# Patient Record
Sex: Male | Born: 1995 | Race: White | Hispanic: No | Marital: Single | Smoking: Never smoker
Health system: Southern US, Community
[De-identification: ages and names within clinical notes are randomized; demographics above are authoritative.]

## PROBLEM LIST (undated history)

## (undated) DIAGNOSIS — L709 Acne, unspecified: Secondary | ICD-10-CM

## (undated) DIAGNOSIS — R51 Headache: Secondary | ICD-10-CM

## (undated) DIAGNOSIS — F909 Attention-deficit hyperactivity disorder, unspecified type: Secondary | ICD-10-CM

## (undated) HISTORY — DX: Headache: R51

## (undated) HISTORY — PX: TONSILLECTOMY: SUR1361

## (undated) HISTORY — PX: ADENOIDECTOMY: SUR15

## (undated) HISTORY — DX: Acne, unspecified: L70.9

## (undated) HISTORY — DX: Attention-deficit hyperactivity disorder, unspecified type: F90.9

---
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
## 2016-12-16 ENCOUNTER — Emergency Department (HOSPITAL_COMMUNITY)
Admission: EM | Admit: 2016-12-16 | Discharge: 2016-12-16 | Disposition: A | Discharge status: Home / Self Care | Payer: Medicaid Other | Attending: Emergency Medicine | Admitting: Emergency Medicine

## 2016-12-16 ENCOUNTER — Encounter (HOSPITAL_COMMUNITY): Payer: Self-pay

## 2016-12-16 DIAGNOSIS — J45909 Unspecified asthma, uncomplicated: Secondary | ICD-10-CM | POA: Insufficient documentation

## 2016-12-16 DIAGNOSIS — R69 Illness, unspecified: Secondary | ICD-10-CM

## 2016-12-16 DIAGNOSIS — F909 Attention-deficit hyperactivity disorder, unspecified type: Secondary | ICD-10-CM | POA: Insufficient documentation

## 2016-12-16 DIAGNOSIS — J111 Influenza due to unidentified influenza virus with other respiratory manifestations: Secondary | ICD-10-CM | POA: Insufficient documentation

## 2016-12-16 DIAGNOSIS — Z79899 Other long term (current) drug therapy: Secondary | ICD-10-CM | POA: Insufficient documentation

## 2016-12-16 DIAGNOSIS — H6123 Impacted cerumen, bilateral: Secondary | ICD-10-CM | POA: Insufficient documentation

## 2016-12-16 NOTE — ED Notes (Signed)
Felt bad since Sunday, on Monday saw [facility] and dx with ear infection, only used the drops x 1, (  Left  Ear), now he has had body aches and chills, hurts to cough,

## 2016-12-16 NOTE — ED Triage Notes (Signed)
Pt reports generalized body aches and weakness that started on Sunday. [facility] with [facility] infection and a stomach bug on Monday. Then on Tuesday he reports he got worse and his entire body started aching. Denies n/v/d in past 24 hours.

## 2016-12-16 NOTE — ED Provider Notes (Signed)
[facility] Provider Note   CSN: [id number] Arrival date & time: 12/16/16  1120   By signing my name below, I, Soijett Blue, attest that this documentation has been prepared under the direction and in the presence of Rolan Bucco, MD. Electronically Signed: Soijett Blue, ED Scribe. 12/16/16. 12:21 PM.  History   Chief Complaint Chief Complaint  Patient presents with  . Generalized Body Aches    HPI Andrew Barton is a 21 y.o. male who presents to the Emergency Department complaining of generalized body aches onset 4 days ago. Pt reports gradually worsening associated resolved vomiting x 2 days ago, left ear pain, sore throat, nasal congestion, rhinorrhea, subjective fever, cough, chest wall tenderness due to cough, and abdominal pain worsened with movement/cough. Pt has tried ibuprofen with no relief of his symptoms. Pt notes that he was evaluated by [facility] 3 days ago and dx with a stomach bug and ear infection and placed on abx ear drops that he stopped using. Denies diarrhea, difficulty urinating, dysuria, rash, and any other symptoms.    The history is provided by the patient. No language interpreter was used.    Past Medical History:  Diagnosis Date  . Acne   . ADHD (attention deficit hyperactivity disorder)   . Asthma   . WUJWJXBJ(478.2)     Patient Active Problem List   Diagnosis Date Noted  . Low back pain 11/20/2012  . Migraine 01/28/2011  . Seasonal allergies 01/28/2011  . Contact dermatitis 01/12/2011  . ADHD 09/02/2009  . ASTHMA, MILD 09/02/2009  . 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 ADENOIDECTOMY    . TONSILLECTOMY         Home Medications    Prior to Admission medications   Medication Sig Start Date End Date Taking? Authorizing Provider  meloxicam (MOBIC) 15 MG tablet Take 1 tablet (15 mg total) by mouth daily. 11/20/12   Shelly Rubenstein, MD    Family History Family History    Problem Relation Age of Onset  . COPD Mother   . Hypertension Mother   . Depression Mother   . Migraines Mother   . Depression Father   . Migraines Sister     Social History Social History  Substance Use Topics  . Smoking status: Never Smoker  . Smokeless tobacco: Never Used  . Alcohol use No     Allergies   Patient has no known allergies.   Review of Systems Review of Systems  Constitutional: Positive for fever (subjective).  HENT: Positive for congestion, ear pain (left), rhinorrhea and sore throat.   Respiratory: Positive for cough.        +Chest wall tenderness due to cough  Gastrointestinal: Positive for abdominal pain (due to cough and movement) and vomiting (resolved). Negative for diarrhea.  Genitourinary: Negative for difficulty urinating and dysuria.  Musculoskeletal: Positive for myalgias.  Skin: Negative for rash.     Physical Exam Updated Vital Signs BP (!) 128/57 (BP Location: Right Arm)   Pulse 62   Temp 98.2 F (36.8 C) (Oral)   Resp 16   SpO2 100%   Physical Exam  Constitutional: He is oriented to person, place, and time. He appears well-developed and well-nourished.  HENT:  Head: Normocephalic and atraumatic.  Bilateral cerumen impaction.  Eyes: Pupils are equal, round, and reactive to light.  Neck: Normal range of motion. Neck supple.  Cardiovascular: Normal rate, regular rhythm and normal heart sounds.  Pulmonary/Chest: Effort normal and breath sounds normal. No respiratory distress. He has no wheezes. He has no rales. He exhibits no tenderness.  Abdominal: Soft. Bowel sounds are normal. There is no tenderness. There is no rebound and no guarding.  Musculoskeletal: Normal range of motion. He exhibits no edema.  Lymphadenopathy:    He has no cervical adenopathy.  Neurological: He is alert and oriented to person, place, and time.  Skin: Skin is warm and dry. No rash noted.  Psychiatric: He has a normal mood and affect.     ED  Treatments / Results  DIAGNOSTIC STUDIES: Oxygen Saturation is 100% on RA, nl by my interpretation.    COORDINATION OF CARE: 12:20 PM Discussed treatment plan with pt at bedside and pt agreed to plan.   Procedures Procedures (including critical care time)  Medications Ordered in ED Medications - No data to display   Initial Impression / Assessment and Plan / ED Course  I have reviewed the triage vital signs and the nursing notes.  PT presents with a flu like illness.  He is well appearing, His symptoms seem to be improving.  No suggestions of pneumonia.  No SOB.  No ongoing vomiting.  Patient given symptomatic care instructions. Return precautions were given.  Final Clinical Impressions(s) / ED Diagnoses   Final diagnoses:  Influenza-like illness    New Prescriptions Discharge Medication List as of 12/16/2016 12:22 PM     I personally performed the services described in this documentation, which was scribed in my presence.  The recorded information has been reviewed and considered.     Rolan BuccoMelanie Dustin Burrill, MD 12/16/16 1248

## 2017-03-25 ENCOUNTER — Encounter: Payer: Self-pay | Admitting: Podiatry

## 2017-03-25 ENCOUNTER — Ambulatory Visit (INDEPENDENT_AMBULATORY_CARE_PROVIDER_SITE_OTHER): Payer: Managed Care, Other (non HMO) | Admitting: Podiatry

## 2017-03-25 DIAGNOSIS — L03012 Cellulitis of left finger: Secondary | ICD-10-CM

## 2017-03-25 DIAGNOSIS — L600 Ingrowing nail: Secondary | ICD-10-CM | POA: Diagnosis not present

## 2017-03-25 MED ORDER — HYDROCODONE-ACETAMINOPHEN 10-325 MG PO TABS: 1.0000 | ORAL_TABLET | Freq: Three times a day (TID) | ORAL | 0 refills | Status: DC | PRN

## 2017-03-25 MED ORDER — CEPHALEXIN 500 MG PO CAPS: 500.0000 mg | ORAL_CAPSULE | Freq: Three times a day (TID) | ORAL | 0 refills | Status: AC

## 2017-03-25 NOTE — Progress Notes (Signed)
   Subjective:    Patient ID: Andrew Barton, male    DOB: 01-Sep-1996, 21 y.o.   MRN: [id number]  HPI Chief Complaint  Patient presents with  . Ingrown Toenail    B/L 1st, 2nd toenail have redness/swollen/dranage for about 3 years.      Review of Systems  Skin: Positive for color change.       Objective:   Physical Exam        Assessment & Plan:

## 2017-03-25 NOTE — Progress Notes (Signed)
Subjective:    Patient ID: Andrew Barton, male   DOB: 21 y.o.   MRN: [id number]   HPI patient presents with caregiver stating that he's had very painful hallux nails bilateral third nails of both feet with chronic ingrown toenails that have made it difficult for him to wear shoe gear comfortably. States she's tried to soak his have the nails removed in the past but never anything permanently    Review of Systems  All other systems reviewed and are negative.       Objective:  Physical Exam  Cardiovascular: Intact distal pulses.   Musculoskeletal: Normal range of motion.  Neurological: He is alert.  Skin: Skin is warm.  Nursing note and vitals reviewed.  neurovascular status intact muscle strength adequate range of motion was found to be within normal limits. Patient's found to have damaged thickened third nail right lateral border right hallux severely damaged hallux nail left and medial border of the third digit right. There chronically irritated and there is ingrown component with redness with no active drainage noted currently. Patient's found to have good digital perfusion and is well oriented 3     Assessment:    Severe nail damage to the hallux left third right with ingrown toenails of the lateral border right hallux medial border third digit left that are painful when palpated and consistent with chronic ingrown toenails     Plan:    H&P conditions reviewed and I've recommended removal of the hallux nail left permanently the third nail right the border of the right hallux and third digit left. I explained procedures and risk to patient and they want to have this done and at this point I infiltrated with 240 mg Xylocaine Marcaine mixture I then remove the hallux nail left and applied phenol 5 applications 30 seconds the nail border third left and applied phenol 3 applications 30 seconds followed by alcohol lavaged the right hallux lateral border 3 applications 30 seconds phenol  followed by alcohol lavaged and total removal of the third nail right with chemical application for treatments 30 seconds followed by alcohol lavage. I then gave instructions on soaks and bandage usage and placed on cephalexin 500 mg 3 times a day as precautionary measure and also went ahead and gave patient prescription for hydrocodone. Patient was instructed on soaks and reappoint

## 2017-03-25 NOTE — Patient Instructions (Signed)

## 2017-04-06 ENCOUNTER — Telehealth: Payer: Self-pay | Admitting: *Deleted

## 2017-04-06 NOTE — Telephone Encounter (Addendum)
Pt's mtr, Melissa states antibiotic pt is taking is not agreeing with him. Left message for Melissa to contact me.04/07/2017-I spoke with pt's mtr, Melissa she states the toes are draining thick pus, and so painful pt can hardly walk, and the antibiotic has his stomach tore up and he has stopped it. I offered to have Melissa schedule pt for an appt tomorrow due to possible infection. I told Melissa that I would also inform Dr. Charlsie Merlesegal to see if he wanted to change the antibiotic as well as see him.D. Miner - scheduler was contacted and she will contact Melissa to schedule for tomorrow.

## 2017-04-07 ENCOUNTER — Ambulatory Visit: Payer: Managed Care, Other (non HMO) | Admitting: Podiatry

## 2017-04-07 NOTE — Telephone Encounter (Signed)
Hi Andrew Barton, I'm returning your call from yesterday. My phone must have died. I don't think the cephalexin is helping any. If you could, please call me back at [phone].

## 2017-04-11 ENCOUNTER — Telehealth: Payer: Self-pay | Admitting: Podiatry

## 2017-04-11 NOTE — Telephone Encounter (Signed)
I told pt he would need to be seen if he was having the pus to the area, pt states he can come in tomorrow. I told him to continue the soaks and antibiotic ointment dressings as discussed before at procedure appt and with his mtr on the the phone. Pt states understanding. Pt give an appt 04/12/2017 at 10:45am with Dr. Al CorpusHyatt per D. Miner.

## 2017-04-11 NOTE — Telephone Encounter (Signed)
Toe/toenail is oozing pus and I am unable to tolerate the antibiotic prescribed to me.

## 2017-04-11 NOTE — Telephone Encounter (Signed)
Yes I have pus coming out of the toe that the procedure was done on.

## 2017-04-12 ENCOUNTER — Encounter: Payer: Self-pay | Admitting: Podiatry

## 2017-04-12 ENCOUNTER — Ambulatory Visit (INDEPENDENT_AMBULATORY_CARE_PROVIDER_SITE_OTHER): Payer: Managed Care, Other (non HMO) | Admitting: Podiatry

## 2017-04-12 DIAGNOSIS — L03039 Cellulitis of unspecified toe: Secondary | ICD-10-CM | POA: Diagnosis not present

## 2017-04-12 MED ORDER — HYDROCODONE-ACETAMINOPHEN 10-325 MG PO TABS: 1.0000 | ORAL_TABLET | Freq: Three times a day (TID) | ORAL | 0 refills | Status: AC | PRN

## 2017-04-12 MED ORDER — DOXYCYCLINE HYCLATE 100 MG PO TABS: 100.0000 mg | ORAL_TABLET | Freq: Two times a day (BID) | ORAL | 0 refills | Status: AC

## 2017-04-12 NOTE — Progress Notes (Signed)
He presents today with a chief complaint of pain to his toes there were surgically corrected. He states that there still is considerable drainage the antibiotic bothering her stomach. He states that he continues to soak his toes and Epsom salts twice daily and there is foul-smelling drainage from his left hallux.  Objective: Pulses are palpable. No calf pain left. Matrixectomy sites. Be healing of the hallux left is the slowest. There is still some serosanguineous drainage but currently I see no purulence even with pressure.  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  Plan: Start soaking Epsom salts and warm water increase to soft concentration to half a cup per 1 L. I also suggest that he discontinue every day twice a day soaking and just go to once every other day he saw also cover during the day and leave open at bedtime is not to use any other medications on the toenails at all. I wrote a prescription for doxycycline and pain medication.

## 2017-04-26 ENCOUNTER — Ambulatory Visit: Payer: 59 | Admitting: Podiatry

## 2017-05-17 ENCOUNTER — Ambulatory Visit: Payer: 59 | Admitting: Podiatry

## 2019-09-16 ENCOUNTER — Ambulatory Visit (INDEPENDENT_AMBULATORY_CARE_PROVIDER_SITE_OTHER): Payer: Self-pay

## 2019-09-16 ENCOUNTER — Encounter (HOSPITAL_COMMUNITY): Payer: Self-pay

## 2019-09-16 ENCOUNTER — Ambulatory Visit (HOSPITAL_COMMUNITY)
Admission: EM | Admit: 2019-09-16 | Discharge: 2019-09-16 | Disposition: A | Discharge status: Home / Self Care | Payer: Self-pay | Attending: Physician Assistant | Admitting: Physician Assistant

## 2019-09-16 ENCOUNTER — Other Ambulatory Visit: Payer: Self-pay

## 2019-09-16 DIAGNOSIS — W208XXA Other cause of strike by thrown, projected or falling object, initial encounter: Secondary | ICD-10-CM

## 2019-09-16 DIAGNOSIS — S92424A Nondisplaced fracture of distal phalanx of right great toe, initial encounter for closed fracture: Secondary | ICD-10-CM

## 2019-09-16 MED ORDER — IBUPROFEN 800 MG PO TABS: 800.0000 mg | ORAL_TABLET | Freq: Three times a day (TID) | ORAL | 0 refills | Status: AC

## 2019-09-16 NOTE — Discharge Instructions (Addendum)
Wear the walking boot given whenever walking for 3-4 weeks . You may transition to firm soled shoes/boots as tolerated.   Take ibuprofen 800mg  1 tablet 3 times a day(every 8 hours) for 3 days scheduled, and then as needed for pain every 8 hours. Do not exceed 3 doses in 24 hours  You may take tylenol/acetaminophen 325mg  2 tablets every 6 hours for pain as well. You may combine this with ibuprofen or stagger these medications. Do not exceed 4 doses of tylenol in 24 horus   Elevate your foot as much as possible for the next 48 hours. You may utilize Ice for the next 24 hours. After tomorrow, utilize heat pads for 20-30 minutes nightly.   If you have severe worsening of pain, loss of feeling, have a fever, notice increased redness spreading up your foot please come back to be re-evaluated.  If you are not having gradual improvement in the next 2-4 weeks please come back for re-evaluation.

## 2019-09-16 NOTE — ED Provider Notes (Signed)
[facility]    CSN: [id number] Arrival date & time: 09/16/19  1034      History   Chief Complaint Chief Complaint  Patient presents with  . Toe Injury    right foot     HPI Andrew Barton is a 24 y.o. male.   Patient reports to urgent care today for right big toe injury. He reports dropping a "200-300 pound coffee table on my foot yesterday morning". He reports the corner of the table fell directly onto his right foot. He was wearing boots but they were not steel-toed. He did not seek care until today because he did not feel the injury was that bad yesterday. This morning he woke up with discoloration, swelling and pain. He describes pain in the Right big toe and in area above his right big toe. He was able to ambulate after and able to ambulate today.   He has no other health concerns and takes no medications. He has never injured this foot.      Past Medical History:  Diagnosis Date  . Acne   . ADHD (attention deficit hyperactivity disorder)   . Asthma   . WUJWJXBJ(478.2)     Patient Active Problem List   Diagnosis Date Noted  . Low back pain 11/20/2012  . Migraine 01/28/2011  . Seasonal allergies 01/28/2011  . Contact dermatitis 01/12/2011  . ADHD 09/02/2009  . ASTHMA, MILD 09/02/2009  . 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 ADENOIDECTOMY    . TONSILLECTOMY         Home Medications    Prior to Admission medications   Medication Sig Start Date End Date Taking? Authorizing Provider  cephALEXin (KEFLEX) 500 MG capsule Take 1 capsule (500 mg total) by mouth 3 (three) times daily. Patient not taking: Reported on 04/12/2017 03/25/17   Lenn Sink, DPM  doxycycline (VIBRA-TABS) 100 MG tablet Take 1 tablet (100 mg total) by mouth 2 (two) times daily. 04/12/17   Hyatt, Max T, DPM  HYDROcodone-acetaminophen (NORCO) 10-325 MG tablet Take 1 tablet by mouth every 8 (eight) hours as needed. 04/12/17   Hyatt, Max  T, DPM  ibuprofen (ADVIL) 800 MG tablet Take 1 tablet (800 mg total) by mouth 3 (three) times daily. 09/16/19   Yaeko Fazekas, Veryl Speak, PA-C  meloxicam (MOBIC) 15 MG tablet Take 1 tablet (15 mg total) by mouth daily. 11/20/12   Shelly Rubenstein, MD    Family History Family History  Problem Relation Age of Onset  . COPD Mother   . Hypertension Mother   . Depression Mother   . Migraines Mother   . Depression Father   . Migraines Sister     Social History Social History   Tobacco Use  . Smoking status: Never Smoker  . Smokeless tobacco: Never Used  Substance Use Topics  . Alcohol use: No  . Drug use: No     Allergies   Patient has no known allergies.   Review of Systems Review of Systems  Constitutional: Negative for chills and fever.  Respiratory: Negative for cough and shortness of breath.   Cardiovascular: Negative for chest pain and palpitations.  Gastrointestinal: Negative for abdominal pain.  Musculoskeletal: Positive for arthralgias and myalgias. Negative for back pain.  Skin: Negative for color change and rash.  Hematological: Does not bruise/bleed easily.  All other systems reviewed and are negative.    Physical Exam Triage Vital Signs ED Triage  Vitals  Enc Vitals Group     BP 09/16/19 1052 (!) 151/73     Pulse Rate 09/16/19 1052 60     Resp 09/16/19 1052 16     Temp 09/16/19 1052 98 F (36.7 C)     Temp Source 09/16/19 1052 Oral     SpO2 09/16/19 1052 99 %     Weight --      Height --      Head Circumference --      Peak Flow --      Pain Score 09/16/19 1050 10     Pain Loc --      Pain Edu? --      Excl. in GC? --    No data found.  Updated Vital Signs BP (!) 151/73 (BP Location: Left Arm)   Pulse 60   Temp 98 F (36.7 C) (Oral)   Resp 16   SpO2 99%   Visual Acuity Right Eye Distance:   Left Eye Distance:   Bilateral Distance:    Right Eye Near:   Left Eye Near:    Bilateral Near:     Physical Exam Constitutional:      General: He  is not in acute distress.    Appearance: Normal appearance.  HENT:     Head: Normocephalic and atraumatic.  Eyes:     Pupils: Pupils are equal, round, and reactive to light.  Cardiovascular:     Rate and Rhythm: Normal rate and regular rhythm.     Pulses: Normal pulses.     Heart sounds: Normal heart sounds.  Pulmonary:     Effort: Pulmonary effort is normal.     Breath sounds: Normal breath sounds.  Musculoskeletal:     Comments: Right Foot- Edema and ecchymosis of the Right Great toe spreading proximally over the mid-foot. Edema is significant when compared to left. There is some tightness to the skin over the mid-foot.  Has focal TTP over the 1st MTP joint and along 1st metatarsal. Has good sensation in the distal great toe.   Skin:    General: Skin is warm and dry.  Neurological:     General: No focal deficit present.     Mental Status: He is alert and oriented to person, place, and time.  Psychiatric:        Mood and Affect: Mood normal.        Behavior: Behavior normal.        Thought Content: Thought content normal.        Judgment: Judgment normal.      UC Treatments / Results  Labs (all labs ordered are listed, but only abnormal results are displayed) Labs Reviewed - No data to display  EKG   Radiology DG Foot 2 Views Right  Result Date: 09/16/2019 CLINICAL DATA:  Posttraumatic right foot pain EXAM: RIGHT FOOT - 2 VIEW COMPARISON:  None available FINDINGS: Oblique, nondisplaced fracture through the shaft and lateral base of the great toe distal phalanx. No intra-articular joint extension. No opaque foreign body or dislocation IMPRESSION: Nondisplaced distal phalanx fracture of the great toe. Electronically Signed   By: Marnee SpringJonathon  Watts M.D.   On: 09/16/2019 11:29    Procedures Procedures (including critical care time)  Medications Ordered in UC Medications - No data to display  Initial Impression / Assessment and Plan / UC Course  I have reviewed the triage  vital signs and the nursing notes.  Pertinent labs & imaging results that were available during  my care of the patient were reviewed by me and considered in my medical decision making (see chart for details).     #Right Great Toe Fracture - Non-displace oblique fracture of the distal phalanx of Right great toe. No joint involvement. Neurovascularly intact. Good cap refill. Placed in post op boot 3-4 weeks with transition as tolerated after this. Pain management with ibuprofen and tylenol, rest and ice for 24 hours with gradual activity increase. Follow up precautions given if needed.    Final Clinical Impressions(s) / UC Diagnoses   Final diagnoses:  Closed nondisplaced fracture of distal phalanx of right great toe, initial encounter     Discharge Instructions     Wear the walking boot given whenever walking for 3-4 weeks . You may transition to firm soled shoes/boots as tolerated.   Take ibuprofen 800mg  1 tablet 3 times a day(every 8 hours) for 3 days scheduled, and then as needed for pain every 8 hours. Do not exceed 3 doses in 24 hours  You may take tylenol/acetaminophen 325mg  2 tablets every 6 hours for pain as well. You may combine this with ibuprofen or stagger these medications. Do not exceed 4 doses of tylenol in 24 horus   Elevate your foot as much as possible for the next 48 hours. You may utilize Ice for the next 24 hours. After tomorrow, utilize heat pads for 20-30 minutes nightly.   If you have severe worsening of pain, loss of feeling, have a fever, notice increased redness spreading up your foot please come back to be re-evaluated.  If you are not having gradual improvement in the next 2-4 weeks please come back for re-evaluation.       ED Prescriptions    Medication Sig Dispense Auth. Provider   ibuprofen (ADVIL) 800 MG tablet Take 1 tablet (800 mg total) by mouth 3 (three) times daily. 21 tablet Shiela Bruns, Marguerita Beards, PA-C     PDMP not reviewed this encounter.     Purnell Shoemaker, PA-C 09/16/19 1308

## 2019-09-16 NOTE — ED Triage Notes (Signed)
Pt present right foot toe injury, pt accidentally dropped something on his right big toe and now it is bruised and purple

## 2020-10-07 IMAGING — DX DG FOOT 2V*R*
2 series · 2 of 2 positions shown · non-contrast
Comparison: None available

CLINICAL DATA: Posttraumatic right foot pain

EXAM:
RIGHT FOOT - 2 VIEW

[foot ap]
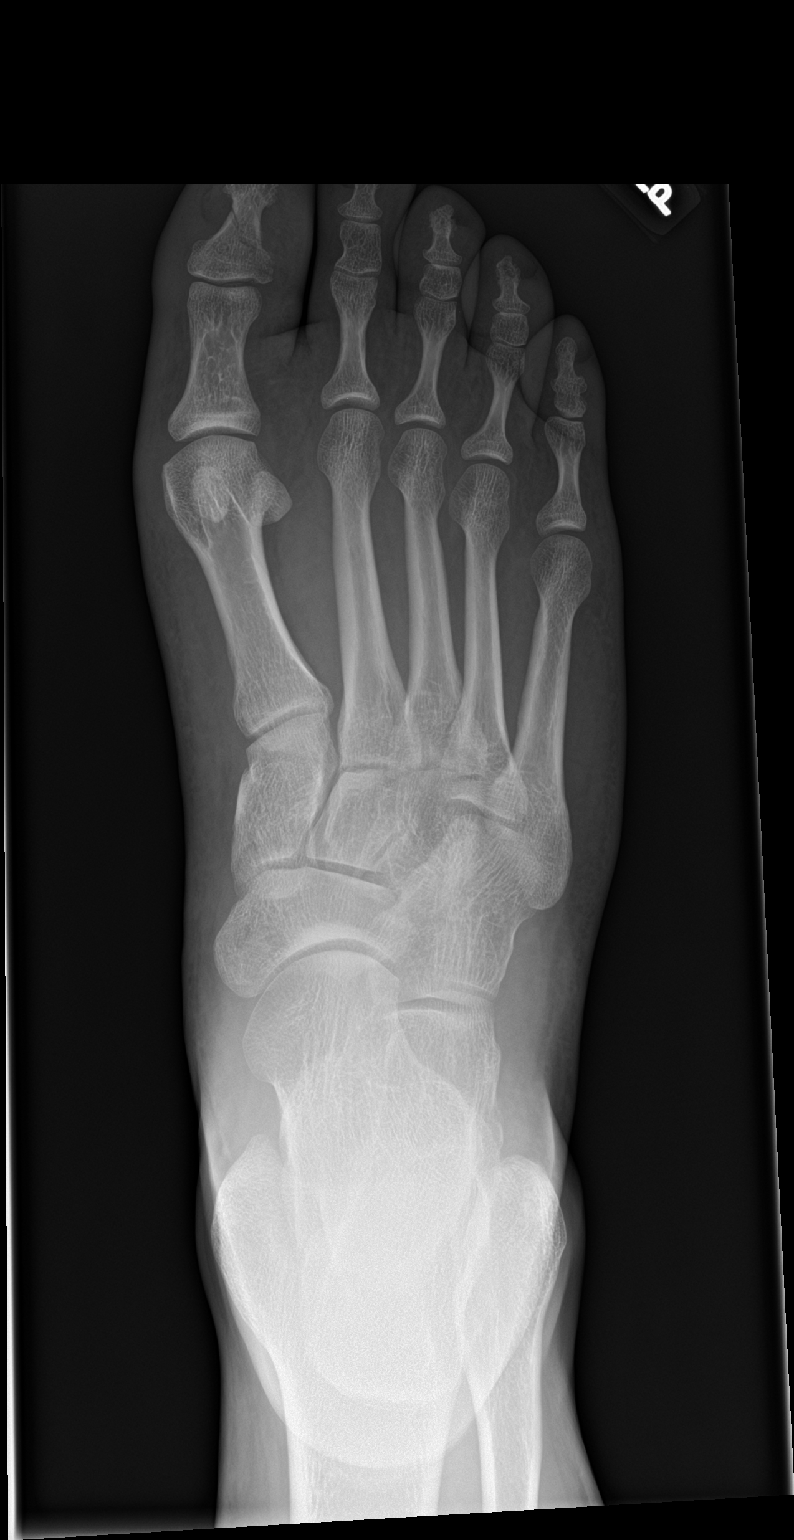

[foot lat]
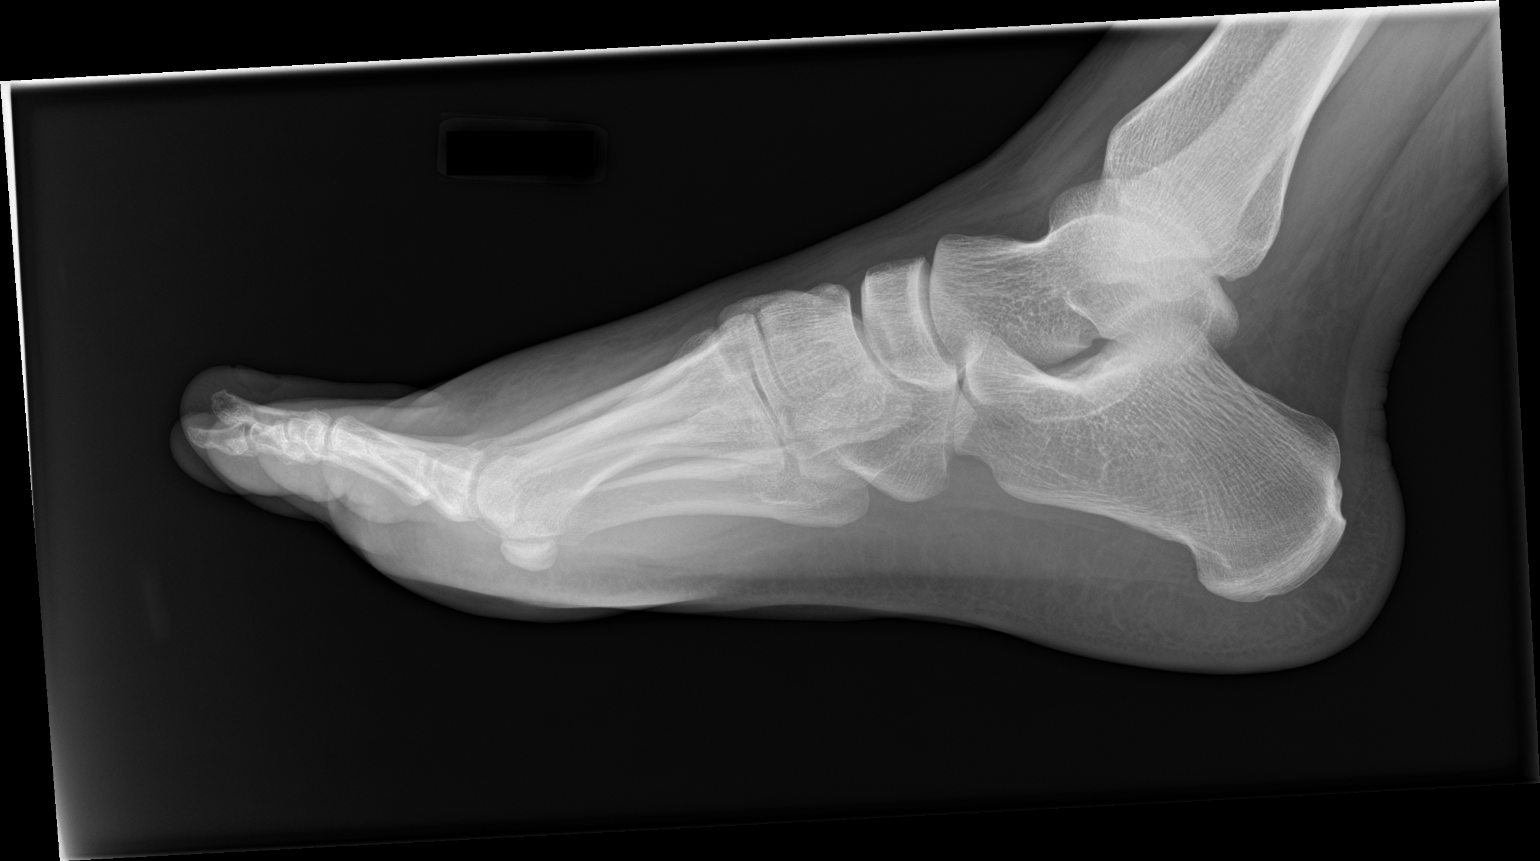

[2 of 2 positions shown; findings below may reference images not displayed]

FINDINGS: Oblique, nondisplaced fracture through the shaft and lateral base of
the great toe distal phalanx. No intra-articular joint extension. No
opaque foreign body or dislocation
IMPRESSION: Nondisplaced distal phalanx fracture of the great toe.
